# Patient Record
Sex: Male | Born: 1980 | Hispanic: Yes | Marital: Married | Smoking: Current some day smoker
Health system: Southern US, Community
[De-identification: ages and names within clinical notes are randomized; demographics above are authoritative.]

## PROBLEM LIST (undated history)

## (undated) DIAGNOSIS — F1020 Alcohol dependence, uncomplicated: Secondary | ICD-10-CM

---

## 2012-10-28 ENCOUNTER — Observation Stay (HOSPITAL_COMMUNITY)
Admission: EM | Admit: 2012-10-28 | Discharge: 2012-10-29 | Disposition: A | Discharge status: Home / Self Care | Payer: Self-pay | Attending: Internal Medicine | Admitting: Internal Medicine

## 2012-10-28 ENCOUNTER — Encounter (HOSPITAL_COMMUNITY): Payer: Self-pay | Admitting: Family Medicine

## 2012-10-28 ENCOUNTER — Emergency Department (HOSPITAL_COMMUNITY): Payer: Self-pay

## 2012-10-28 DIAGNOSIS — R0602 Shortness of breath: Secondary | ICD-10-CM | POA: Insufficient documentation

## 2012-10-28 DIAGNOSIS — F1010 Alcohol abuse, uncomplicated: Secondary | ICD-10-CM | POA: Insufficient documentation

## 2012-10-28 DIAGNOSIS — R109 Unspecified abdominal pain: Secondary | ICD-10-CM | POA: Insufficient documentation

## 2012-10-28 DIAGNOSIS — E872 Acidosis, unspecified: Secondary | ICD-10-CM | POA: Insufficient documentation

## 2012-10-28 DIAGNOSIS — R0789 Other chest pain: Secondary | ICD-10-CM | POA: Diagnosis present

## 2012-10-28 DIAGNOSIS — K2920 Alcoholic gastritis without bleeding: Secondary | ICD-10-CM

## 2012-10-28 DIAGNOSIS — E876 Hypokalemia: Secondary | ICD-10-CM | POA: Insufficient documentation

## 2012-10-28 DIAGNOSIS — R799 Abnormal finding of blood chemistry, unspecified: Secondary | ICD-10-CM | POA: Insufficient documentation

## 2012-10-28 DIAGNOSIS — R072 Precordial pain: Principal | ICD-10-CM | POA: Insufficient documentation

## 2012-10-28 DIAGNOSIS — R1110 Vomiting, unspecified: Secondary | ICD-10-CM

## 2012-10-28 LAB — URINALYSIS, ROUTINE W REFLEX MICROSCOPIC
Glucose, UA: NEGATIVE mg/dL
Hgb urine dipstick: NEGATIVE
Leukocytes, UA: NEGATIVE
Protein, ur: NEGATIVE mg/dL
Specific Gravity, Urine: 1.027 (ref 1.005–1.030)
pH: 5.5 (ref 5.0–8.0)

## 2012-10-28 LAB — POCT I-STAT TROPONIN I: Troponin i, poc: 0.01 ng/mL (ref 0.00–0.08)

## 2012-10-28 LAB — BASIC METABOLIC PANEL
Calcium: 9.6 mg/dL (ref 8.4–10.5)
GFR calc non Af Amer: >90 mL/min (ref >90)
Glucose, Bld: 114 mg/dL — ABNORMAL HIGH (ref 70–99)
Potassium: 3.2 mEq/L — ABNORMAL LOW (ref 3.5–5.1)
Sodium: 139 mEq/L (ref 135–145)

## 2012-10-28 LAB — POCT I-STAT 3, ART BLOOD GAS (G3+)
Acid-base deficit: 2 mmol/L (ref 0.0–2.0)
pCO2 arterial: 36.2 mmHg (ref 35.0–45.0)
pH, Arterial: 7.399 (ref 7.350–7.450)
pO2, Arterial: 77 mmHg — ABNORMAL LOW (ref 80.0–100.0)

## 2012-10-28 LAB — HEPATIC FUNCTION PANEL
Albumin: 4.5 g/dL (ref 3.5–5.2)
Indirect Bilirubin: 0.7 mg/dL (ref 0.3–0.9)
Total Protein: 8.6 g/dL — ABNORMAL HIGH (ref 6.0–8.3)

## 2012-10-28 LAB — CG4 I-STAT (LACTIC ACID): Lactic Acid, Venous: 2.32 mmol/L — ABNORMAL HIGH (ref 0.5–2.2)

## 2012-10-28 LAB — RAPID URINE DRUG SCREEN, HOSP PERFORMED
Cocaine: NOT DETECTED
Opiates: POSITIVE — AB

## 2012-10-28 LAB — CBC
Hemoglobin: 17.2 g/dL — ABNORMAL HIGH (ref 13.0–17.0)
MCH: 31 pg (ref 26.0–34.0)
MCHC: 36.8 g/dL — ABNORMAL HIGH (ref 30.0–36.0)
Platelets: 259 10*3/uL (ref 150–400)
RBC: 5.55 MIL/uL (ref 4.22–5.81)

## 2012-10-28 LAB — PRO B NATRIURETIC PEPTIDE: Pro B Natriuretic peptide (BNP): 8.3 pg/mL (ref 0–125)

## 2012-10-28 LAB — LIPASE, BLOOD: Lipase: 35 U/L (ref 11–59)

## 2012-10-28 LAB — SALICYLATE LEVEL: Salicylate Lvl: <2 mg/dL — ABNORMAL LOW (ref 2.8–20.0)

## 2012-10-28 LAB — MAGNESIUM: Magnesium: 2.1 mg/dL (ref 1.5–2.5)

## 2012-10-28 MED ORDER — POTASSIUM CHLORIDE IN NACL 20-0.9 MEQ/L-% IV SOLN
INTRAVENOUS | Status: DC
  Administered 2012-10-28 – 2012-10-29 (×2): via INTRAVENOUS
  Filled 2012-10-28 (×4): qty 1000

## 2012-10-28 MED ORDER — ADULT MULTIVITAMIN W/MINERALS CH
1.0000 | ORAL_TABLET | Freq: Every day | ORAL | Status: DC
Start: 2012-10-28 — End: 2012-10-29
  Administered 2012-10-28 – 2012-10-29 (×2): 1 via ORAL
  Filled 2012-10-28 (×2): qty 1

## 2012-10-28 MED ORDER — NITROGLYCERIN 0.4 MG SL SUBL: 0.4000 mg | SUBLINGUAL_TABLET | SUBLINGUAL | Status: DC | PRN

## 2012-10-28 MED ORDER — GI COCKTAIL ~~LOC~~: 30.0000 mL | Freq: Three times a day (TID) | ORAL | Status: DC | PRN

## 2012-10-28 MED ORDER — HEPARIN SODIUM (PORCINE) 5000 UNIT/ML IJ SOLN
5000.0000 [IU] | Freq: Three times a day (TID) | INTRAMUSCULAR | Status: DC
  Administered 2012-10-28 – 2012-10-29 (×2): 5000 [IU] via SUBCUTANEOUS
  Filled 2012-10-28 (×5): qty 1

## 2012-10-28 MED ORDER — ONDANSETRON HCL 4 MG/2ML IJ SOLN: 4.0000 mg | Freq: Four times a day (QID) | INTRAMUSCULAR | Status: DC | PRN

## 2012-10-28 MED ORDER — SODIUM CHLORIDE 0.9 % IJ SOLN: 3.0000 mL | Freq: Two times a day (BID) | INTRAMUSCULAR | Status: DC

## 2012-10-28 MED ORDER — PNEUMOCOCCAL VAC POLYVALENT 25 MCG/0.5ML IJ INJ
0.5000 mL | INJECTION | INTRAMUSCULAR | Status: AC
  Administered 2012-10-29: 0.5 mL via INTRAMUSCULAR
  Filled 2012-10-28: qty 0.5

## 2012-10-28 MED ORDER — SODIUM CHLORIDE 0.9 % IV BOLUS (SEPSIS)
500.0000 mL | Freq: Once | INTRAVENOUS | Status: AC
  Administered 2012-10-28: 1000 mL via INTRAVENOUS

## 2012-10-28 MED ORDER — GI COCKTAIL ~~LOC~~
30.0000 mL | Freq: Once | ORAL | Status: AC
  Administered 2012-10-28: 30 mL via ORAL
  Filled 2012-10-28: qty 30

## 2012-10-28 MED ORDER — ASPIRIN EC 325 MG PO TBEC
325.0000 mg | DELAYED_RELEASE_TABLET | Freq: Once | ORAL | Status: AC
  Administered 2012-10-28: 325 mg via ORAL
  Filled 2012-10-28: qty 1

## 2012-10-28 MED ORDER — PANTOPRAZOLE SODIUM 40 MG PO TBEC
40.0000 mg | DELAYED_RELEASE_TABLET | Freq: Two times a day (BID) | ORAL | Status: DC
  Administered 2012-10-28 – 2012-10-29 (×2): 40 mg via ORAL
  Filled 2012-10-28 (×2): qty 1

## 2012-10-28 MED ORDER — SODIUM CHLORIDE 0.9 % IV SOLN
Freq: Once | INTRAVENOUS | Status: AC
  Administered 2012-10-28: 16:00:00 via INTRAVENOUS

## 2012-10-28 MED ORDER — LORAZEPAM 2 MG/ML IJ SOLN: 1.0000 mg | Freq: Four times a day (QID) | INTRAMUSCULAR | Status: DC | PRN

## 2012-10-28 MED ORDER — THIAMINE HCL 100 MG/ML IJ SOLN
100.0000 mg | Freq: Every day | INTRAMUSCULAR | Status: DC
  Filled 2012-10-28 (×2): qty 1

## 2012-10-28 MED ORDER — ONDANSETRON HCL 8 MG PO TABS
4.0000 mg | ORAL_TABLET | Freq: Four times a day (QID) | ORAL | Status: DC | PRN
  Filled 2012-10-28: qty 0.5

## 2012-10-28 MED ORDER — MORPHINE SULFATE 2 MG/ML IJ SOLN: 1.0000 mg | INTRAMUSCULAR | Status: DC | PRN

## 2012-10-28 MED ORDER — LORAZEPAM 1 MG PO TABS: 1.0000 mg | ORAL_TABLET | Freq: Four times a day (QID) | ORAL | Status: DC | PRN

## 2012-10-28 MED ORDER — ASPIRIN 81 MG PO CHEW: 81.0000 mg | CHEWABLE_TABLET | Freq: Every day | ORAL | Status: DC

## 2012-10-28 MED ORDER — VITAMIN B-1 100 MG PO TABS
100.0000 mg | ORAL_TABLET | Freq: Every day | ORAL | Status: DC
  Administered 2012-10-28: 100 mg via ORAL
  Filled 2012-10-28 (×2): qty 1

## 2012-10-28 MED ORDER — FOLIC ACID 1 MG PO TABS
1.0000 mg | ORAL_TABLET | Freq: Every day | ORAL | Status: DC
  Administered 2012-10-28: 1 mg via ORAL
  Filled 2012-10-28 (×2): qty 1

## 2012-10-28 MED ORDER — POTASSIUM CHLORIDE CRYS ER 20 MEQ PO TBCR
40.0000 meq | EXTENDED_RELEASE_TABLET | Freq: Once | ORAL | Status: AC
  Administered 2012-10-28: 40 meq via ORAL
  Filled 2012-10-28: qty 2

## 2012-10-28 MED ORDER — MORPHINE SULFATE 4 MG/ML IJ SOLN
2.0000 mg | Freq: Once | INTRAMUSCULAR | Status: AC
Start: 2012-10-28 — End: 2012-10-28
  Administered 2012-10-28: 2 mg via INTRAVENOUS
  Filled 2012-10-28: qty 1

## 2012-10-28 NOTE — H&P (Signed)
Hospital Admission Note Date: 10/28/2012  Patient name: Darren Ellis Medical record number: [id number] Date of birth: [birth year] Age: 32 y.o. Gender: male PCP: No primary provider on file.  Medical Service:Internal Medicine Teaching Service  Attending physician: Dr. Eben Burow    1st Contact: Dr. Cicero Duck 2nd Contact: Dr. Dorise Hiss    Pager:[phone] After 5 pm or weekends: 1st Contact:      Pager: [phone] 2nd Contact:      Pager: [phone]  Chief Complaint: Substernal chest pain  History of Present Illness:Darren Ellis is a 32 year old spanish speaking male with no pcp presenting to the ED today with complaints of substernal sharp chest pain.  The pain is located in the middle of his chest near the xiphoid process, 8/10, non-radiating, burning in nature, constant, worse with deep inspiration and movement, not exacerbated with food or drink, and slightly improved with GI cocktail.  His wife is present in the room as well.  They explain that he drank 6 12oz beers yesterday and shortly after he started having this pain.  He could not lie flat at night to sleep due to the discomfort and has associated shortness of breath with difficulty catching his breath due to pain.  His wife also explains that while they were driving and in triage, he suddenly started clenching his fists and his jaw and seemed to not respond for some time.  He does not recall much of that episode but thinks it was related to the pain.  They deny any similar episodes in the past with the fist clenching, and deny such sharp chest pain, but does admit to mild discomfort in the past with alcohol.  He has not taken anything for the pain at home, endorses dry heaving, minimal vomiting and nausea but denies any current fever, abdominal pain, or any urinary complaints at this time.  He does endorse b/l elbow pain. Since arrival in the ED he has been having chills as well.    Meds: No current outpatient prescriptions on  file.  Allergies: Allergies as of 10/28/2012  . (No Known Allergies)   History reviewed. No pertinent past medical history. History reviewed. No pertinent past surgical history. History reviewed. No pertinent family history. History   Social History  . Marital Status: Married    Spouse Name: N/A    Number of Children: N/A  . Years of Education: N/A   Occupational History  . construction     dry wall, paintaing, insulation   Social History Main Topics  . Smoking status: Current Every Day Smoker    Types: Cigars  . Smokeless tobacco: Not on file  . Alcohol Use: Yes     Comment: occ. a few beers  . Drug Use: No  . Sexually Active: Yes -- Male partner(s)   Other Topics Concern  . Not on file   Social History Narrative  . No narrative on file   Review of Systems:  Constitutional:   Chills.  Denies fever, diaphoresis, appetite change and fatigue.   HEENT:  Denies congestion, sore throat, rhinorrhea, sneezing, mouth sores, trouble swallowing, neck pain   Respiratory:  Pain with inspiration causing shallow breathing.  Denies SOB, DOE, cough, and wheezing.   Cardiovascular:  Denies palpitations and leg swelling. Substernal burning chest pain.   Gastrointestinal:  Nausea, vomiting.  Denies abdominal pain, diarrhea, constipation, blood in stool and abdominal distention.   Genitourinary:  Denies dysuria, urgency, frequency, hematuria, flank pain and difficulty urinating.  Musculoskeletal:  B/L elbow pain and episode of fist clenching.  Denies myalgias, back pain, joint swelling, and gait problem.   Skin:  Denies pallor, rash and wound.   Neurological:  Denies dizziness, seizures, syncope, weakness, light-headedness, numbness and headaches.    Physical Exam: Blood pressure 136/84, pulse 100, temperature 98 F (36.7 C), temperature source Oral, resp. rate 19, SpO2 100.00%. Vitals reviewed. General: resting in bed, acute distress due to pain HEENT: PERRL, EOMI, no scleral  icterus Cardiac: Tachycardia, no rubs, murmurs or gallops Chest: tenderness to palpation mid chest between breasts at nipple line Pulm: clear to auscultation bilaterally, no wheezes, rales, or rhonchi, shallow breathing Abd: soft, nontender, nondistended, BS present Ext: warm and well perfused, no pedal edema Neuro: alert and oriented X3, cranial nerves II-XII grossly intact, strength slightly weaker in RUE as compared to LUE, poor effort, and sensation to light touch equal in bilateral upper and lower extremities  Lab results: Basic Metabolic Panel:  Recent Labs  09/81/19 1429  NA 139  K 3.2*  CL 99  CO2 16*  GLUCOSE 114*  BUN 11  CREATININE 0.77  CALCIUM 9.6   Liver Function Tests:  Recent Labs  10/28/12 1429  AST 42*  ALT 35  ALKPHOS 96  BILITOT 0.9  PROT 8.6*  ALBUMIN 4.5    Recent Labs  10/28/12 1429  LIPASE 35   CBC:  Recent Labs  10/28/12 1429  WBC 10.9*  HGB 17.2*  HCT 46.7  MCV 84.1  PLT 259   BNP:  Recent Labs  10/28/12 1429  PROBNP 8.3   Urine Drug Screen: Drugs of Abuse     Component Value Date/Time   LABOPIA POSITIVE* 10/28/2012 1613   COCAINSCRNUR NONE DETECTED 10/28/2012 1613   LABBENZ NONE DETECTED 10/28/2012 1613   AMPHETMU NONE DETECTED 10/28/2012 1613   THCU NONE DETECTED 10/28/2012 1613   LABBARB NONE DETECTED 10/28/2012 1613    Alcohol Level:  Recent Labs  10/28/12 1556  ETH <11   Urinalysis:  Recent Labs  10/28/12 1613  COLORURINE YELLOW  LABSPEC 1.027  PHURINE 5.5  GLUCOSEU NEGATIVE  HGBUR NEGATIVE  BILIRUBINUR SMALL*  KETONESUR 15*  PROTEINUR NEGATIVE  UROBILINOGEN 1.0  NITRITE NEGATIVE  LEUKOCYTESUR NEGATIVE   Imaging results:  Dg Chest Port 1 View  10/28/2012  *RADIOLOGY REPORT*  Clinical Data: Abdominal pain, shortness of breath.  PORTABLE CHEST - 1 VIEW  Comparison: None  Findings: Heart size is accentuated by low volumes.  No confluent airspace opacity or effusion.  No acute bony abnormality.   IMPRESSION: Low lung volumes.  No acute findings.   Original Report Authenticated By: Charlett Nose, M.D.    Other results: EKG: 78bpm NSR  Assessment & Plan by Problem: Darren Ellis is a 32 year old male Spanish speaking male admitted for substernal chest discomfort.      Substernal chest pain--x2 days, localized to mid chest between breasts, tender to palpation, burning sensation, non-radiating, 8/10.  Started after drinking 6 beers yesterday.  Reports mild discomfort in the past with alcohol but not as severe.  No dysphagia, no radiation to neck or shoulders.  Pain could be secondary to gastritis as a result of chronic EtOH use vs. Hiatal hernia vs. MSK vs. Esophagitis (no hx of pill intake but does have GERD symptoms).  He denies NSAID use and has no abdominal pain, making PUD less likely.  Pancreatitis is considered given alcohol history, however, no epigastric pain, no radiation of pain to the back,  and Lipase WNL 35.  Mild improvement of pain with GI cocktail in ED.  CXR on admission low lung volumes, no acute findings. No air under the diaphragm making perforation less likely. Cannot rule out ACS, although no radiatio of pain and tender to palpation.  Troponin x1 poc in ED negative 0.01.  No known hx of HTN or DM but has not seen a doctor in a long time.  -admit to tele -cycle CE -ASA -gi cocktail -zofran prn nausea -protonix 40mg  qd -risk stratify: HbA1C, and lipid panel -AM labs: CBC, BMET -morphine prn pain -nitroglycerin prn -AM EKG -oxygen therapy prn, keep o2 sat >90% -consider GI consult if pain worsens    Increased anion gap--AG 24 on admission with Bicarb on admission cmet: 16.  per ABG: pH 7.399,  PCO2: 36.2, P02: 77, HCO3: 22.4. o2 sat: 95%.  Delta delta 1.75 signifying additional metabolic alkalosis (possibly secondary to vomiting) and per winters formula, expected PaCO2=30-34, but actual CO2 was 36.2, therefore more than expected value signifying additional respiratory  disturbance (likely secondary to hypoventilation due to pain with inspiration).  Recent alcohol intake of 6 beers yesterday.  Denies intake of any other substances.  No signs of uremia with Cr 0.77 on admission.  Lactic acid slightly elevated at 2.32. U/A: 15 ketones with pH 5.5 with small bilirubin.  Denies hx of DM with glucose on cmet 114. Possible alcoholic ketoacidosis in setting of recent binge day prior to admisison.  Salicylate level negative on admission.    -f/u serum osmolality and calculate osmolar gap -IVF  -CIWA protocol including thiamine and folate -trend bmet    Hypokalemia--K 3.2 on admission.  Possibly 2/2 GI losses, endorsed some vomiting prior to admission.  -replaced and NS with K IVF -f/u magnesium     ?seizure like activity--wife endorses one episode of fist clenching and tight jaw, but still able to speak at time of extreme pain at time of triage.  She claims he was slow to respond verbally and staring off in space but moving his eyes and talking.  Given hx of alcohol abuse, will need to monitor closely for signs of withdrawal.  Neuro exam was significant for only slight right sided upper extremity weakness however seemed more effort dependent as he was lying down at an angle to prevent exacerbation of pain.  Otherwise, CN's were intact and sensation to pin prick grossly intact and equal as well.  Denies any headaches.  -monitor for withdrawal  -CIWA protocol  -if behavior returns, will consider further work up  Diet: Regular diet as tolerated DVT Ppx: Heparin  Dispo: Disposition is deferred at this time, awaiting improvement of current medical problems. Anticipated discharge in approximately 1-2 day(s).   The patient does not have a current PCP (No primary provider on file.), therefore will be requiring OPC follow-up after discharge.   The patient does not have transportation limitations that hinder transportation to clinic appointments.  SignedDarden Palmer 10/28/2012, 5:26 PM

## 2012-10-28 NOTE — ED Provider Notes (Signed)
Medical screening examination/treatment/procedure(s) were conducted as a shared visit with non-physician practitioner(s) and myself.  I personally evaluated the patient during the encounter   Nelia Shi, MD 10/28/12 [phone]

## 2012-10-28 NOTE — ED Notes (Signed)
Per pt sts about 2 hours ago started to have burning in his stomach and SOB. sts he drank 6 beers last night. Denies N,V,D. sts his hands keep cramping up. Pt A&Ox3.

## 2012-10-28 NOTE — ED Provider Notes (Signed)
History     CSN: [id number]  Arrival date & time 10/28/12  1315   First MD Initiated Contact with Patient 10/28/12 1333      Chief Complaint  Patient presents with  . Abdominal Pain  . Shortness of Breath   an interpreter was used to obtain history of present illness  (Consider location/radiation/quality/duration/timing/severity/associated sxs/prior treatment) HPI  Patient is a 32 year old male with no past medical history presenting to the ED for intermittent weeklong at the epigastric pain, that worsened this morning. Patient describes pain as burning with radiation to central chest there is a 9/10 right now. He is having associated hand muscle cramping that began this morning as well. He also states he is having some chills and sweating. Patient denies any cardiac or GI history. Patient also denies any familial cardiac or GI history. He denies any abdominal surgeries. Denies any nausea, vomiting, diarrhea. Patient has been able to tolerate by mouth intake without difficulty. He states that has not worsened epigastric pain. Denies any fevers. Denies ever having a similar presentation like this in past. Patient states he drank "6 beers last night." Denies drinking this amount of alcohol every night. States it is intermittent.   History reviewed. No pertinent past medical history.  History reviewed. No pertinent past surgical history.  History reviewed. No pertinent family history.  History  Substance Use Topics  . Smoking status: Never Smoker   . Smokeless tobacco: Not on file  . Alcohol Use: Yes     Comment: occ. a few beers      Review of Systems  Constitutional: Positive for chills. Negative for appetite change.  HENT: Negative.   Eyes: Negative.   Respiratory: Positive for chest tightness and shortness of breath.   Gastrointestinal: Positive for abdominal pain. Negative for nausea, vomiting, diarrhea, blood in stool, anal bleeding and rectal pain.  Genitourinary:  Negative.   Musculoskeletal:       Hand cramping  Skin: Negative.   Neurological: Negative.  Negative for weakness.    Allergies  Review of patient's allergies indicates no known allergies.  Home Medications  No current outpatient prescriptions on file.  BP 136/84  Pulse 100  Temp(Src) 98 F (36.7 C) (Oral)  Resp 19  SpO2 100%  Physical Exam  Constitutional: He is oriented to person, place, and time. He appears well-developed and well-nourished.  Mildly diaphoretic  HENT:  Head: Normocephalic and atraumatic.  Mouth/Throat: Oropharynx is clear and moist. No oropharyngeal exudate.  Eyes: Conjunctivae and EOM are normal. Pupils are equal, round, and reactive to light.  Neck: Neck supple.  Cardiovascular: Normal rate, regular rhythm and normal heart sounds.   Pulses:      Posterior tibial pulses are 2+ on the right side, and 2+ on the left side.  Pulmonary/Chest: Effort normal and breath sounds normal. No respiratory distress. He has no wheezes. He has no rales. He exhibits no tenderness.  Abdominal: Soft. Bowel sounds are normal. He exhibits no distension. There is no tenderness. There is no rigidity, no rebound, no guarding and negative Murphy's sign.  Neurological: He is alert and oriented to person, place, and time.  Skin: Skin is warm and dry.  Psychiatric: His mood appears anxious.    ED Course  Procedures (including critical care time)  Medications  sodium chloride 0.9 % bolus 500 mL (not administered)  0.9 %  sodium chloride infusion (not administered)  gi cocktail (Maalox,Lidocaine,Donnatal) (30 mLs Oral Given 10/28/12 1437)  morphine 4 MG/ML injection 2  mg (2 mg Intravenous Given 10/28/12 1437)    Labs Reviewed  CBC - Abnormal; Notable for the following:    WBC 10.9 (*)    Hemoglobin 17.2 (*)    MCHC 36.8 (*)    All other components within normal limits  BASIC METABOLIC PANEL - Abnormal; Notable for the following:    Potassium 3.2 (*)    CO2 16 (*)     Glucose, Bld 114 (*)    All other components within normal limits  HEPATIC FUNCTION PANEL - Abnormal; Notable for the following:    Total Protein 8.6 (*)    AST 42 (*)    All other components within normal limits  LIPASE, BLOOD  PRO B NATRIURETIC PEPTIDE  POCT I-STAT TROPONIN I   Dg Chest Port 1 View  10/28/2012  *RADIOLOGY REPORT*  Clinical Data: Abdominal pain, shortness of breath.  PORTABLE CHEST - 1 VIEW  Comparison: None  Findings: Heart size is accentuated by low volumes.  No confluent airspace opacity or effusion.  No acute bony abnormality.  IMPRESSION: Low lung volumes.  No acute findings.   Original Report Authenticated By: Charlett Nose, M.D.    Anion gap 24 - possible ETOH induced acidosis.   Hospitalist consulted for admission.   1. Alcoholic gastritis   2. High anion gap metabolic acidosis       MDM  Based on patient presentation and anion gap he will be admitted. The patient appears reasonably stabilized for admission considering the current resources, flow, and capabilities available in the ED at this time, and I doubt any other [facility] requiring further screening and/or treatment in the ED prior to admission. Patient d/w with Dr. Radford Pax, agrees with plan.           Jeannetta Ellis, PA-C 10/28/12 1606

## 2012-10-28 NOTE — ED Notes (Signed)
Admitting MD at bedside.

## 2012-10-29 DIAGNOSIS — F1010 Alcohol abuse, uncomplicated: Secondary | ICD-10-CM | POA: Diagnosis not present

## 2012-10-29 DIAGNOSIS — R0789 Other chest pain: Secondary | ICD-10-CM | POA: Diagnosis present

## 2012-10-29 LAB — LIPID PANEL
Cholesterol: 166 mg/dL (ref 0–200)
Triglycerides: 106 mg/dL (ref <150)

## 2012-10-29 LAB — BASIC METABOLIC PANEL
Calcium: 8.9 mg/dL (ref 8.4–10.5)
Creatinine, Ser: 0.74 mg/dL (ref 0.50–1.35)
GFR calc non Af Amer: >90 mL/min (ref >90)
Glucose, Bld: 125 mg/dL — ABNORMAL HIGH (ref 70–99)
Sodium: 136 mEq/L (ref 135–145)

## 2012-10-29 LAB — CBC
MCH: 30.2 pg (ref 26.0–34.0)
MCV: 83.9 fL (ref 78.0–100.0)
Platelets: 207 10*3/uL (ref 150–400)
RBC: 4.83 MIL/uL (ref 4.22–5.81)
RDW: 12.8 % (ref 11.5–15.5)

## 2012-10-29 LAB — HEMOGLOBIN A1C: Hgb A1c MFr Bld: 5.3 % (ref <5.7)

## 2012-10-29 LAB — TROPONIN I: Troponin I: <0.3 ng/mL (ref <0.30)

## 2012-10-29 MED ORDER — ADULT MULTIVITAMIN W/MINERALS CH: 1.0000 | ORAL_TABLET | Freq: Every day | ORAL | Status: DC

## 2012-10-29 MED ORDER — THIAMINE HCL 100 MG PO TABS: 100.0000 mg | ORAL_TABLET | Freq: Every day | ORAL | Status: DC

## 2012-10-29 MED ORDER — FOLIC ACID 1 MG PO TABS: 1.0000 mg | ORAL_TABLET | Freq: Every day | ORAL | Status: DC

## 2012-10-29 MED ORDER — OMEPRAZOLE 40 MG PO CPDR: DELAYED_RELEASE_CAPSULE | ORAL | Status: DC

## 2012-10-29 MED ORDER — OMEPRAZOLE 40 MG PO CPDR: 40.0000 mg | DELAYED_RELEASE_CAPSULE | Freq: Every day | ORAL | Status: DC

## 2012-10-29 NOTE — H&P (Signed)
Internal Medicine Attending Admission Note Date: 10/29/2012  Patient name: Darren Ellis Medical record number: [id number] Date of birth: [birth year] Age: 32 y.o. Gender: male  I saw and evaluated the patient. I reviewed the resident's note and I agree with the resident's findings and plan as documented in the resident's note.  Chief Complaint(s): Chest pain  History - key components related to admission: Patient is a 32 year old Spanish-speaking male with no past medical history and no primary care physician who presented to emergency room after complaints of substernal chest pain. Patient did binge drinking on the night prior to admission with about 6 12 ounce beers. Pain started shortly after drinking alcohol. Present in the middle of his chest, upper abdomen, 8/10 in intensity, nonradiating, burning in nature, constant, worse with deep breathing, no other exacerbating factors. Patient also endorsed relief in symptoms of her GI cocktail.  15 point review of system was negative except what is noted above.  Past medical history, past surgical history, medications, family history and social history was reviewed and as per resident's note.   Physical Exam - key components related to admission:  Filed Vitals:   10/28/12 1832 10/28/12 1835 10/29/12 0006 10/29/12 0605  BP: 132/81 126/74 109/68 104/64  Pulse: 77 67 64 59  Temp:   98 F (36.7 C) 98.1 F (36.7 C)  TempSrc:      Resp:   18 16  Height:      Weight:      SpO2:   99% 98%  Physical Exam: General: Vital signs reviewed and noted. Well-developed, well-nourished, in no acute distress; alert, appropriate and cooperative throughout examination.  Head: Normocephalic, atraumatic.  Eyes: PERRL, EOMI, No signs of anemia or jaundince.  Nose: Mucous membranes moist, not inflammed, nonerythematous.  Throat: Oropharynx nonerythematous, no exudate appreciated.   Neck: No deformities, masses, or tenderness noted.Supple, No carotid Bruits, no  JVD.  Lungs:   tender to palpation above the sternum, Normal respiratory effort. Clear to auscultation BL without crackles or wheezes.  Heart: RRR. S1 and S2 normal without gallop, murmur, or rubs.  Abdomen:  BS normoactive. Soft, Nondistended, non-tender.  No masses or organomegaly.  Extremities: No pretibial edema.  Neurologic: A&O X3, CN II - XII are grossly intact. Motor strength is 5/5 in the all 4 extremities, Sensations intact to light touch, Cerebellar signs negative.  Skin: No visible rashes, scars.   Lab results:  Basic Metabolic Panel:  Recent Labs  11/91/47 1429 10/28/12 1823 10/28/12 2355  NA 139  --  136  K 3.2*  --  3.7  CL 99  --  103  CO2 16*  --  26  GLUCOSE 114*  --  125*  BUN 11  --  11  CREATININE 0.77  --  0.74  CALCIUM 9.6  --  8.9  MG  --  2.1  --    Liver Function Tests:  Recent Labs  10/28/12 1429  AST 42*  ALT 35  ALKPHOS 96  BILITOT 0.9  PROT 8.6*  ALBUMIN 4.5    Recent Labs  10/28/12 1429  LIPASE 35   CBC:  Recent Labs  10/28/12 1429 10/28/12 2355  WBC 10.9* 8.6  HGB 17.2* 14.6  HCT 46.7 40.5  MCV 84.1 83.9  PLT 259 207   Cardiac Enzymes:  Recent Labs  10/28/12 1823 10/28/12 2356  TROPONINI <0.30 <0.30   Fasting Lipid Panel:  Recent Labs  10/28/12 2355  CHOL 166  HDL 40  LDLCALC 105*  TRIG 106  CHOLHDL 4.2    Alcohol Level:  Recent Labs  10/28/12 1556  ETH <11   Imaging results:  Dg Chest Port 1 View  10/28/2012  *RADIOLOGY REPORT*  Clinical Data: Abdominal pain, shortness of breath.  PORTABLE CHEST - 1 VIEW  Comparison: None  Findings: Heart size is accentuated by low volumes.  No confluent airspace opacity or effusion.  No acute bony abnormality.  IMPRESSION: Low lung volumes.  No acute findings.   Original Report Authenticated By: Charlett Nose, M.D.     Other results: EKG: 78 beats per minute, sinus rhythm, early repolarization in leads V3 which could be from incorrect lead placement. No ST or T  wave changes noted  Assessment & Plan by Problem:  Principal Problem:   Substernal chest pain Active Problems:   Vomiting   Hypokalemia   Metabolic acidosis, increased anion gap  Patient is a 32 year old healthy Spanish-speaking male who was admitted with atypical chest pain after binge drinking.   1. Atypical chest pain: Likelihood of this being coronary in origin is extremely low based on history and physical exam(chest pain is palpable and worse with deep breathing). This is most likely secondary to musculoskeletal versus acute esophagitis from binge drinking and vomiting. Given the low likelihood of ACS based on his age and risk factor profile, I do not think that he needs to be evaluated further for acute coronary syndrome in outpatient setting.  2. anion gap metabolic acidosis: This was most likely secondary to alcoholic acidosis. Patient's anion gap has closed this morning with IV fluids. I would not look for any other cause at this time.  We should advance patient's diet and anticipate discharge today if he is able to tolerate regular diet well. We should discharge home on twice daily PPI for 2 weeks with OPC follow up. Counseled for avoiding alcohol and smoking for atleast 2 weeks.   Rest of the medical management as per resident's.  Lars Mage MD Faculty-Internal Medicine Residency Program

## 2012-10-29 NOTE — Progress Notes (Signed)
Pt discharged to home per MD order. Pt and wife received and reviewed all discharge instructions and medication information including follow-up appointments and prescriptions. Pt and wife verbalized understanding. Pt alert and oriented at discharge with no complaints of pain. Pt ambulated to private vehicle per pt request at discharge. Efraim Kaufmann

## 2012-10-29 NOTE — Discharge Summary (Signed)
Internal Medicine Teaching [facility] Discharge Note  Name: Darren Ellis MRN: [id number] DOB: 06-Oct-1980 32 y.o.  Date of Admission: 10/28/2012  1:19 PM Date of Discharge: 10/29/2012 Attending Physician: Dr. Eben Burow Discharge Diagnosis: Principal Problem:   Atypical chest pain Active Problems:   Alcohol abuse   Hypokalemia   Metabolic acidosis, increased anion gap  Discharge Medications:   Medication List    TAKE these medications       multivitamin with minerals Tabs  Take 1 tablet by mouth daily.     omeprazole 40 MG capsule  Commonly known as:  PRILOSEC  Take 40mg  twice daily x2 weeks then 40mg  once daily x6 weeks       Disposition and follow-up:   Darren Ellis was discharged from [facility] in Stable condition.  At the hospital follow up visit please address: Esophagitis--presented with severe substernal pain.  D/c on 2 weeks of prilosec 40 bid and then 6 weeks of prilosec qd with likely titration to low dose and then h2 blocker if symptoms improve.  If pain worsening, concern of PUD, will need GI eval, possible EGD.  Lifestyle modifications recommended as well.   Anion gap acidosis: likely 2/2 alcoholic ketoacidosis--strongly urged to quit alcohol. AG 7 on discharge from 24 on admission.  Improvement with fluids.     [facility] card needed, no insurance. Needs to establish PCP  Alcohol abuse: no signs of withdrawal during admission, placed on CIWA.  Cessation strongly advised.  Continue cessation counseling and monitor for withdrawal.    Follow-up Appointments:      Discharge Orders   Future Orders Complete By Expires     Call MD for:  severe uncontrolled pain  As directed     Call MD for:  As directed     Comments:      Persistent diarrhea after taking medication    Diet - low sodium heart healthy  As directed     Increase activity slowly  As directed       Procedures Performed:  Dg Chest Port 1 View  10/28/2012  *RADIOLOGY REPORT*   Clinical Data: Abdominal pain, shortness of breath.  PORTABLE CHEST - 1 VIEW  Comparison: None  Findings: Heart size is accentuated by low volumes.  No confluent airspace opacity or effusion.  No acute bony abnormality.  IMPRESSION: Low lung volumes.  No acute findings.   Original Report Authenticated By: Charlett Nose, M.D.    Admission HPI: Darren Ellis is a 32 year old spanish speaking male with no pcp presenting to the ED today with complaints of substernal sharp chest pain. The pain is located in the middle of his chest near the xiphoid process, 8/10, non-radiating, burning in nature, constant, worse with deep inspiration and movement, not exacerbated with food or drink, and slightly improved with GI cocktail. His wife is present in the room as well. They explain that he drank 6 12oz beers yesterday and shortly after he started having this pain. He could not lie flat at night to sleep due to the discomfort and has associated shortness of breath with difficulty catching his breath due to pain. His wife also explains that while they were driving and in triage, he suddenly started clenching his fists and his jaw and seemed to not respond for some time. He does not recall much of that episode but thinks it was related to the pain. They deny any similar episodes in the past with the fist clenching, and deny such sharp  chest pain, but does admit to mild discomfort in the past with alcohol. He has not taken anything for the pain at home, endorses dry heaving, minimal vomiting and nausea but denies any current fever, abdominal pain, or any urinary complaints at this time. He does endorse b/l elbow pain. Since arrival in the ED he has been having chills as well.   Hospital Course by problem list:    Atypical chest pain--likely secondary to alcohol induced esophagitis vs. MSK.  Improved during hospital course.  Presented initially with x2 days, localized to mid chest between breasts, tender to palpation, burning  sensation, non-radiating, 8/10. Started after drinking 6 beers day prior to admission. Reported mild discomfort in the past with alcohol but not as severe. No dysphagia, no radiation to neck or shoulders. Improvement in ED with GI cocktail.  Other differentials may include Hiatal hernia.  He denies NSAID use and has no abdominal pain at this time. However, due to severity of pain on admission concern for possible PUD as well. CXR on admission showed low lung volumes, no acute findings. No air under the diaphragm making perforation less likely. ACS was considered but likelihood low based on physical exam and history.  Cardiac enzymes were negative.  Lipid panel: LDL 105, TG 106, HDL 40, Chol 166.  HbA1C pending.  Recommended cessation of alcohol and smoking cigars. Recommend lifestyle modifications, diet modifications avoiding GERD triggers, and discharged on PPI therapy for 8 weeks.  With concern of possible PUD due to severity of pain, suggested prilosec 40mg  bid x2 weeks and then transition to qd dosing for 6 weeks with eventual decrease to low dose PPIs and then to H2RAs if mild or intermittent symptoms with eventual discontinuation of acid suppression if remains asymptomatic. If symptoms persist or worsen, may benefit from outpatient GI follow up and possible EGD; however, he has no insurance and thus will likely be unable to afford. He will need follow up with a PCP (needs to be established) and in the mean time have recommended follow up in IM OPC.      Hypokalemia----K 3.2 on admission. Possibly 2/2 GI losses, endorsed some vomiting prior to admission. Mag 2.1.  K improved to 3.7 during hospital course.     Increased anion gap acidosis--likely secondary to alcoholic ketoacidosis as a result of binge drinking prior to admission.  AG resolved during hospital course.  AG 24 on admission with Bicarb on admission cmet: 16. per ABG: pH 7.399, PCO2: 36.2, P02: 77, HCO3: 22.4. o2 sat: 95%. Delta delta 1.75  signifying additional metabolic alkalosis (possibly secondary to vomiting) and per winters formula, expected PaCO2=30-34, but actual CO2 was 36.2, therefore more than expected value signifying additional respiratory disturbance (likely secondary to hypoventilation due to pain with inspiration). Recent alcohol intake of 6 beers yesterday. Denied intake of any other substances. No signs of uremia with Cr 0.77 on admission. Lactic acid was slightly elevated at 2.32. U/A: 15 ketones with pH 5.5 with small bilirubin. Denies hx of DM with glucose on cmet 114. Salicylate level negative on admission. Serum osmolality 288.  AG down to 7 on discharge with improvement with IVF. Counseled extensively on alcohol cessation.  Follow up with PCP.     Alcohol Abuse--reports drinking 12 oz beers x6 day prior to admission that resulted in pain and also hx of frequent alcohol use.  Brief episode as described by wife of fist and jaw clenching during pain episode in triage with starting off but moving eyes and  talking. No similar episodes during admission.  Placed on CIWA protocol with thiamine and folate during admission but no signs of withdrawal during hospital course.   Counseling provided and cessation strongly advised.  Will need to follow up with PCP and monitored for symptoms of withdrawal.  If symptoms similar to fist or jaw clenching return, may need further follow up with PCP and work up.    Discharge Vitals:  BP 104/64  Pulse 59  Temp(Src) 98.1 F (36.7 C) (Oral)  Resp 16  Ht 5\' 7"  (1.702 m)  Wt 153 lb 6.4 oz (69.582 kg)  BMI 24.02 kg/m2  SpO2 98%  Discharge Labs:  Results for orders placed during the hospital encounter of 10/28/12 (from the past 24 hour(s))  PRO B NATRIURETIC PEPTIDE     Status: None   Collection Time    10/28/12  2:29 PM      Result Value Range   Pro B Natriuretic peptide (BNP) 8.3  0 - 125 pg/mL  CBC     Status: Abnormal   Collection Time    10/28/12  2:29 PM      Result Value Range    WBC 10.9 (*) 4.0 - 10.5 K/uL   RBC 5.55  4.22 - 5.81 MIL/uL   Hemoglobin 17.2 (*) 13.0 - 17.0 g/dL   HCT 16.1  09.6 - 04.5 %   MCV 84.1  78.0 - 100.0 fL   MCH 31.0  26.0 - 34.0 pg   MCHC 36.8 (*) 30.0 - 36.0 g/dL   RDW 40.9  81.1 - 91.4 %   Platelets 259  150 - 400 K/uL  BASIC METABOLIC PANEL     Status: Abnormal   Collection Time    10/28/12  2:29 PM      Result Value Range   Sodium 139  135 - 145 mEq/L   Potassium 3.2 (*) 3.5 - 5.1 mEq/L   Chloride 99  96 - 112 mEq/L   CO2 16 (*) 19 - 32 mEq/L   Glucose, Bld 114 (*) 70 - 99 mg/dL   BUN 11  6 - 23 mg/dL   Creatinine, Ser 7.82  0.50 - 1.35 mg/dL   Calcium 9.6  8.4 - 95.6 mg/dL   GFR calc non Af Amer >90  >90 mL/min   GFR calc Af Amer >90  >90 mL/min  LIPASE, BLOOD     Status: None   Collection Time    10/28/12  2:29 PM      Result Value Range   Lipase 35  11 - 59 U/L  HEPATIC FUNCTION PANEL     Status: Abnormal   Collection Time    10/28/12  2:29 PM      Result Value Range   Total Protein 8.6 (*) 6.0 - 8.3 g/dL   Albumin 4.5  3.5 - 5.2 g/dL   AST 42 (*) 0 - 37 U/L   ALT 35  0 - 53 U/L   Alkaline Phosphatase 96  39 - 117 U/L   Total Bilirubin 0.9  0.3 - 1.2 mg/dL   Bilirubin, Direct 0.2  0.0 - 0.3 mg/dL   Indirect Bilirubin 0.7  0.3 - 0.9 mg/dL  POCT I-STAT TROPONIN I     Status: None   Collection Time    10/28/12  2:36 PM      Result Value Range   Troponin i, poc 0.01  0.00 - 0.08 ng/mL   Comment 3  CG4 I-STAT (LACTIC ACID)     Status: Abnormal   Collection Time    10/28/12  3:50 PM      Result Value Range   Lactic Acid, Venous 2.32 (*) 0.5 - 2.2 mmol/L  ETHANOL     Status: None   Collection Time    10/28/12  3:56 PM      Result Value Range   Alcohol, Ethyl (B) <11  0 - 11 mg/dL  SALICYLATE LEVEL     Status: Abnormal   Collection Time    10/28/12  3:56 PM      Result Value Range   Salicylate Lvl <2.0 (*) 2.8 - 20.0 mg/dL  URINE RAPID DRUG SCREEN (HOSP PERFORMED)     Status: Abnormal    Collection Time    10/28/12  4:13 PM      Result Value Range   Opiates POSITIVE (*) NONE DETECTED   Cocaine NONE DETECTED  NONE DETECTED   Benzodiazepines NONE DETECTED  NONE DETECTED   Amphetamines NONE DETECTED  NONE DETECTED   Tetrahydrocannabinol NONE DETECTED  NONE DETECTED   Barbiturates NONE DETECTED  NONE DETECTED  URINALYSIS, ROUTINE W REFLEX MICROSCOPIC     Status: Abnormal   Collection Time    10/28/12  4:13 PM      Result Value Range   Color, Urine YELLOW  YELLOW   APPearance CLEAR  CLEAR   Specific Gravity, Urine 1.027  1.005 - 1.030   pH 5.5  5.0 - 8.0   Glucose, UA NEGATIVE  NEGATIVE mg/dL   Hgb urine dipstick NEGATIVE  NEGATIVE   Bilirubin Urine SMALL (*) NEGATIVE   Ketones, ur 15 (*) NEGATIVE mg/dL   Protein, ur NEGATIVE  NEGATIVE mg/dL   Urobilinogen, UA 1.0  0.0 - 1.0 mg/dL   Nitrite NEGATIVE  NEGATIVE   Leukocytes, UA NEGATIVE  NEGATIVE  POCT I-STAT 3, BLOOD GAS (G3+)     Status: Abnormal   Collection Time    10/28/12  4:25 PM      Result Value Range   pH, Arterial 7.399  7.350 - 7.450   pCO2 arterial 36.2  35.0 - 45.0 mmHg   pO2, Arterial 77.0 (*) 80.0 - 100.0 mmHg   Bicarbonate 22.4  20.0 - 24.0 mEq/L   TCO2 23  0 - 100 mmol/L   O2 Saturation 95.0     Acid-base deficit 2.0  0.0 - 2.0 mmol/L   Patient temperature 37.0 C     Collection site RADIAL, ALLEN'S TEST ACCEPTABLE     Drawn by RT     Sample type ARTERIAL    OSMOLALITY     Status: None   Collection Time    10/28/12  6:20 PM      Result Value Range   Osmolality 288  275 - 300 mOsm/kg  MAGNESIUM     Status: None   Collection Time    10/28/12  6:23 PM      Result Value Range   Magnesium 2.1  1.5 - 2.5 mg/dL  TROPONIN I     Status: None   Collection Time    10/28/12  6:23 PM      Result Value Range   Troponin I <0.30  <0.30 ng/mL  BASIC METABOLIC PANEL     Status: Abnormal   Collection Time    10/28/12 11:55 PM      Result Value Range   Sodium 136  135 - 145 mEq/L   Potassium 3.7  3.5  -  5.1 mEq/L   Chloride 103  96 - 112 mEq/L   CO2 26  19 - 32 mEq/L   Glucose, Bld 125 (*) 70 - 99 mg/dL   BUN 11  6 - 23 mg/dL   Creatinine, Ser 9.52  0.50 - 1.35 mg/dL   Calcium 8.9  8.4 - 84.1 mg/dL   GFR calc non Af Amer >90  >90 mL/min   GFR calc Af Amer >90  >90 mL/min  CBC     Status: None   Collection Time    10/28/12 11:55 PM      Result Value Range   WBC 8.6  4.0 - 10.5 K/uL   RBC 4.83  4.22 - 5.81 MIL/uL   Hemoglobin 14.6  13.0 - 17.0 g/dL   HCT 32.4  40.1 - 02.7 %   MCV 83.9  78.0 - 100.0 fL   MCH 30.2  26.0 - 34.0 pg   MCHC 36.0  30.0 - 36.0 g/dL   RDW 25.3  66.4 - 40.3 %   Platelets 207  150 - 400 K/uL  LIPID PANEL     Status: Abnormal   Collection Time    10/28/12 11:55 PM      Result Value Range   Cholesterol 166  0 - 200 mg/dL   Triglycerides 474  <259 mg/dL   HDL 40  >56 mg/dL   Total CHOL/HDL Ratio 4.2     VLDL 21  0 - 40 mg/dL   LDL Cholesterol 387 (*) 0 - 99 mg/dL  TROPONIN I     Status: None   Collection Time    10/28/12 11:56 PM      Result Value Range   Troponin I <0.30  <0.30 ng/mL   Signed: Darden Palmer 10/29/2012, 12:45 PM   Time Spent on Discharge: 35 minutes Services Ordered on Discharge: none Equipment Ordered on Discharge: none

## 2012-10-29 NOTE — Progress Notes (Addendum)
Subjective: Darren Ellis was seen and examined at bedside this morning with his wife present in the room as well.  He reports remarkable improvement in his pain this morning.  He tolerated his breakfast well, denies any N/V/D, and has no pain at this time with improvement in his breathing as well.  He would like to be home for easter.  We discussed need for PCP follow up.    Objective: Vital signs in last 24 hours: Filed Vitals:   10/28/12 1832 10/28/12 1835 10/29/12 0006 10/29/12 0605  BP: 132/81 126/74 109/68 104/64  Pulse: 77 67 64 59  Temp:   98 F (36.7 C) 98.1 F (36.7 C)  TempSrc:      Resp:   18 16  Height:      Weight:      SpO2:   99% 98%   Weight change:   Intake/Output Summary (Last 24 hours) at 10/29/12 0815 Last data filed at 10/29/12 0200  Gross per 24 hour  Intake  932.5 ml  Output      0 ml  Net  932.5 ml   Vitals reviewed. General: resting in bed, NAD HEENT: PERRL, EOMI, no scleral icterus Cardiac: RRR, no rubs, murmurs or gallops Chest: no tenderness to palpation of sternal region Pulm: clear to auscultation bilaterally, no wheezes, rales, or rhonchi Abd: soft, nontender, nondistended, BS present Ext: warm and well perfused, no pedal edema, +2dp b/l Neuro: alert and oriented X3, cranial nerves II-XII grossly intact, strength and sensation to light touch equal in bilateral upper and lower extremities  Lab Results: Basic Metabolic Panel:  Recent Labs Lab 10/28/12 1429 10/28/12 1823 10/28/12 2355  NA 139  --  136  K 3.2*  --  3.7  CL 99  --  103  CO2 16*  --  26  GLUCOSE 114*  --  125*  BUN 11  --  11  CREATININE 0.77  --  0.74  CALCIUM 9.6  --  8.9  MG  --  2.1  --    Liver Function Tests:  Recent Labs Lab 10/28/12 1429  AST 42*  ALT 35  ALKPHOS 96  BILITOT 0.9  PROT 8.6*  ALBUMIN 4.5    Recent Labs Lab 10/28/12 1429  LIPASE 35   CBC:  Recent Labs Lab 10/28/12 1429 10/28/12 2355  WBC 10.9* 8.6  HGB 17.2* 14.6  HCT 46.7  40.5  MCV 84.1 83.9  PLT 259 207   Cardiac Enzymes:  Recent Labs Lab 10/28/12 1823 10/28/12 2356  TROPONINI <0.30 <0.30   BNP:  Recent Labs Lab 10/28/12 1429  PROBNP 8.3   Fasting Lipid Panel:  Recent Labs Lab 10/28/12 2355  CHOL 166  HDL 40  LDLCALC 105*  TRIG 106  CHOLHDL 4.2   Urine Drug Screen: Drugs of Abuse     Component Value Date/Time   LABOPIA POSITIVE* 10/28/2012 1613   COCAINSCRNUR NONE DETECTED 10/28/2012 1613   LABBENZ NONE DETECTED 10/28/2012 1613   AMPHETMU NONE DETECTED 10/28/2012 1613   THCU NONE DETECTED 10/28/2012 1613   LABBARB NONE DETECTED 10/28/2012 1613    Alcohol Level:  Recent Labs Lab 10/28/12 1556  ETH <11   Urinalysis:  Recent Labs Lab 10/28/12 1613  COLORURINE YELLOW  LABSPEC 1.027  PHURINE 5.5  GLUCOSEU NEGATIVE  HGBUR NEGATIVE  BILIRUBINUR SMALL*  KETONESUR 15*  PROTEINUR NEGATIVE  UROBILINOGEN 1.0  NITRITE NEGATIVE  LEUKOCYTESUR NEGATIVE   Studies/Results: Dg Chest Port 1 View  10/28/2012  *RADIOLOGY  REPORT*  Clinical Data: Abdominal pain, shortness of breath.  PORTABLE CHEST - 1 VIEW  Comparison: None  Findings: Heart size is accentuated by low volumes.  No confluent airspace opacity or effusion.  No acute bony abnormality.  IMPRESSION: Low lung volumes.  No acute findings.   Original Report Authenticated By: Charlett Nose, M.D.    Medications: I have reviewed the patient's current medications. Scheduled Meds: . aspirin  81 mg Oral Daily  . folic acid  1 mg Oral Daily  . heparin  5,000 Units Subcutaneous Q8H  . multivitamin with minerals  1 tablet Oral Daily  . pantoprazole  40 mg Oral BID AC  . pneumococcal 23 valent vaccine  0.5 mL Intramuscular Tomorrow-1000  . sodium chloride  3 mL Intravenous Q12H  . thiamine  100 mg Oral Daily   Or  . thiamine  100 mg Intravenous Daily   Continuous Infusions: . 0.9 % NaCl with KCl 20 mEq / L 125 mL/hr at 10/29/12 0303   PRN Meds:.gi cocktail, LORazepam, LORazepam,  morphine injection, nitroGLYCERIN, ondansetron (ZOFRAN) IV, ondansetron  Assessment/Plan: Darren Ellis is a 32 year old male Spanish speaking male admitted for substernal chest discomfort.   Substernal chest pain--Resolved today.  Presented initially with x2 days, localized to mid chest between breasts, tender to palpation, burning sensation, non-radiating, 8/10. Started after drinking 6 beers yesterday. Reports mild discomfort in the past with alcohol but not as severe. No dysphagia, no radiation to neck or shoulders. Pain likely secondary to esophagitis induced by alcohol intake.  Other differentials may include Hiatal hernia vs. MSK vs. He denies NSAID use and has no abdominal pain at this time.  However, due to severity of pain on admission concern for possible PUD as well.  CXR on admission low lung volumes, no acute findings. No air under the diaphragm making perforation less likely.    -troponin x3 negative including poc in ED -ASA  -gi cocktail prn -zofran prn nausea  -protonix 40mg  qd  -risk stratify: HbA1C, and lipid panel: LDL 105, TG 106, HDL 40, Chol 166 -morphine prn pain  -nitroglycerin prn  -consider outpatient GI follow up if symptoms persist -due to severity of pain on admission and frequency of episodes especially with alcohol intake and concern for possible PUD as well, recommend lifestyle and dietary modifications along with standard dose PPI for eight weeks and then subsequent decrease to low dose PPIs and then to H2RAs if mild or intermittent symptoms with eventual discontinuation of acid suppression if remains asymptomatic.  If symptoms persist or worsen, may benefit from outpatient GI follow up and possible EGD; however, he has no insurance and thus will likely be unable to afford.  He will need follow up with PCP and in the mean time have recommended follow up in IM OPC.    Increased anion gap--Resolved.  AG 24 on admission with Bicarb on admission cmet: 16. per ABG: pH  7.399, PCO2: 36.2, P02: 77, HCO3: 22.4. o2 sat: 95%. Delta delta 1.75 signifying additional metabolic alkalosis (possibly secondary to vomiting) and per winters formula, expected PaCO2=30-34, but actual CO2 was 36.2, therefore more than expected value signifying additional respiratory disturbance (likely secondary to hypoventilation due to pain with inspiration). Recent alcohol intake of 6 beers yesterday. Denies intake of any other substances. No signs of uremia with Cr 0.77 on admission. Lactic acid slightly elevated at 2.32. U/A: 15 ketones with pH 5.5 with small bilirubin. Denies hx of DM with glucose on cmet 114. Likely  secondary to alcoholic ketoacidosis in setting of recent binge day prior to admisison. Salicylate level negative on admission.  -f/u serum osmolality: 288 with no osmolar gap -AG resolved to 7 with IVF -CIWA protocol including thiamine and folate  -counseled extensively on alcohol cessation  Hypokalemia--K 3.2 on admission. Possibly 2/2 GI losses, endorsed some vomiting prior to admission. Mag 2.1 -K improved to 3.7  ?seizure like activity--No more episodes during admission.  Likely secondary to pain.  Wife endorsed one episode of fist clenching and tight jaw, but still able to speak at time of extreme pain at time of triage. She claims he was slow to respond verbally and staring off in space but moving his eyes and talking. Given hx of alcohol abuse, will need to monitor closely for signs of withdrawal, no symptoms present at this time. Neuro exam intiially was significant for only slight right sided upper extremity weakness however seemed more effort dependent as he was lying down at an angle to prevent exacerbation of pain. Otherwise, CN's were intact and sensation to pin prick grossly intact and equal as well. Denies any headaches.  -no signs of withdrawal -CIWA protocol  -if behavior returns, will consider further work up   Diet: Regular diet as tolerated  DVT Ppx: Heparin   Dispo: d/c home today The patient does not have a current PCP (No primary provider on file.), therefore will be requiring OPC follow-up after discharge.  The patient does not have transportation limitations that hinder transportation to clinic appointments.  Services Needed at time of discharge: Y = Yes, Blank = No PT:   OT:   RN:   Equipment:   Other:     LOS: 1 day   Darden Palmer 10/29/2012, 8:15 AM

## 2012-11-14 ENCOUNTER — Encounter: Payer: Self-pay | Admitting: Internal Medicine

## 2013-10-28 ENCOUNTER — Emergency Department (HOSPITAL_COMMUNITY)
Admission: EM | Admit: 2013-10-28 | Discharge: 2013-10-29 | Disposition: A | Discharge status: Home / Self Care | Payer: Self-pay | Attending: Emergency Medicine | Admitting: Emergency Medicine

## 2013-10-28 ENCOUNTER — Encounter (HOSPITAL_COMMUNITY): Payer: Self-pay | Admitting: Emergency Medicine

## 2013-10-28 DIAGNOSIS — F17200 Nicotine dependence, unspecified, uncomplicated: Secondary | ICD-10-CM | POA: Insufficient documentation

## 2013-10-28 DIAGNOSIS — K2990 Gastroduodenitis, unspecified, without bleeding: Principal | ICD-10-CM

## 2013-10-28 DIAGNOSIS — K2970 Gastritis, unspecified, without bleeding: Secondary | ICD-10-CM | POA: Insufficient documentation

## 2013-10-28 HISTORY — DX: Alcohol dependence, uncomplicated: F10.20

## 2013-10-28 LAB — COMPREHENSIVE METABOLIC PANEL
ALK PHOS: 89 U/L (ref 39–117)
ALT: 32 U/L (ref 0–53)
AST: 33 U/L (ref 0–37)
Albumin: 4.1 g/dL (ref 3.5–5.2)
BUN: 9 mg/dL (ref 6–23)
CALCIUM: 9.5 mg/dL (ref 8.4–10.5)
CO2: 21 meq/L (ref 19–32)
Chloride: 101 mEq/L (ref 96–112)
Creatinine, Ser: 0.6 mg/dL (ref 0.50–1.35)
GFR calc Af Amer: >90 mL/min (ref >90)
Glucose, Bld: 96 mg/dL (ref 70–99)
POTASSIUM: 3.4 meq/L — AB (ref 3.7–5.3)
SODIUM: 140 meq/L (ref 137–147)
Total Bilirubin: 0.6 mg/dL (ref 0.3–1.2)
Total Protein: 7.7 g/dL (ref 6.0–8.3)

## 2013-10-28 LAB — CBC WITH DIFFERENTIAL/PLATELET
BASOS ABS: 0.1 10*3/uL (ref 0.0–0.1)
Basophils Relative: 1 % (ref 0–1)
EOS PCT: 1 % (ref 0–5)
Eosinophils Absolute: 0.1 10*3/uL (ref 0.0–0.7)
HCT: 41.7 % (ref 39.0–52.0)
Hemoglobin: 14.7 g/dL (ref 13.0–17.0)
LYMPHS ABS: 2.5 10*3/uL (ref 0.7–4.0)
LYMPHS PCT: 29 % (ref 12–46)
MCH: 30.4 pg (ref 26.0–34.0)
MCHC: 35.3 g/dL (ref 30.0–36.0)
MCV: 86.3 fL (ref 78.0–100.0)
Monocytes Absolute: 0.6 10*3/uL (ref 0.1–1.0)
Monocytes Relative: 7 % (ref 3–12)
NEUTROS ABS: 5.4 10*3/uL (ref 1.7–7.7)
NEUTROS PCT: 63 % (ref 43–77)
PLATELETS: 249 10*3/uL (ref 150–400)
RBC: 4.83 MIL/uL (ref 4.22–5.81)
RDW: 13.1 % (ref 11.5–15.5)
WBC: 8.6 10*3/uL (ref 4.0–10.5)

## 2013-10-28 LAB — LIPASE, BLOOD: Lipase: 54 U/L (ref 11–59)

## 2013-10-28 LAB — ETHANOL: Alcohol, Ethyl (B): 98 mg/dL — ABNORMAL HIGH (ref 0–11)

## 2013-10-28 MED ORDER — ONDANSETRON HCL 4 MG/2ML IJ SOLN: 4.0000 mg | Freq: Once | INTRAMUSCULAR | Status: DC

## 2013-10-28 NOTE — ED Notes (Signed)
Patient offered IV Zofran Patient refused, denies N/V--[facility] appears in NAD

## 2013-10-28 NOTE — ED Provider Notes (Signed)
CSN: [id number]     Arrival date & time 10/28/13  2227 History   First MD Initiated Contact with Patient 10/28/13 2307     Chief Complaint  Patient presents with  . Abdominal Pain  . Emesis     (Consider location/radiation/quality/duration/timing/severity/associated sxs/prior Treatment) HPI Comments: Pt comes in with cc of abd pain. Pt has no medical problems. Started having abd pain x 2 days, worse today. Pain is located in epigastrium, and moves to the sides. No hx of similar pain. No abd surgeries. Pain is not radiating to the back. Pt admits to drinking 2-3 beers a day. No hx of heavy nsaids use.  Patient is a 33 y.o. male presenting with abdominal pain and vomiting. The history is provided by the patient.  Abdominal Pain Associated symptoms: nausea and vomiting   Associated symptoms: no chest pain, no cough, no dysuria and no shortness of breath   Emesis Associated symptoms: abdominal pain     Past Medical History  Diagnosis Date  . Alcoholic    No past surgical history on file. No family history on file. History  Substance Use Topics  . Smoking status: Current Some Day Smoker    Types: Cigarettes  . Smokeless tobacco: Never Used  . Alcohol Use: Yes     Comment: 6 beers a day    Review of Systems  Constitutional: Negative for activity change and appetite change.  Respiratory: Negative for cough and shortness of breath.   Cardiovascular: Negative for chest pain.  Gastrointestinal: Positive for nausea and abdominal pain.  Genitourinary: Negative for dysuria.  All other systems reviewed and are negative.     Allergies  Review of patient's allergies indicates no known allergies.  Home Medications   Prior to Admission medications   Not on File   BP 110/65  Pulse 80  Temp(Src) 98.7 F (37.1 C) (Oral)  Resp 20  SpO2 98% Physical Exam  Nursing note and vitals reviewed. Constitutional: He is oriented to person, place, and time. He appears well-developed.   HENT:  Head: Normocephalic and atraumatic.  Eyes: Conjunctivae and EOM are normal. Pupils are equal, round, and reactive to light.  Neck: Normal range of motion. Neck supple.  Cardiovascular: Normal rate and regular rhythm.   Pulmonary/Chest: Effort normal and breath sounds normal.  Abdominal: Soft. Bowel sounds are normal. He exhibits no distension. There is tenderness. There is no rebound and no guarding.  Epigastric tenderness  Neurological: He is alert and oriented to person, place, and time.  Skin: Skin is warm.    ED Course  Procedures (including critical care time) Labs Review Labs Reviewed  CBC WITH DIFFERENTIAL  COMPREHENSIVE METABOLIC PANEL  LIPASE, BLOOD  ETHANOL    Imaging Review No results found.   EKG Interpretation None      MDM   Final diagnoses:  None    DDx includes: Pancreatitis Hepatobiliary pathology including cholecystitis Gastritis/PUD SBO ACS syndrome Aortic Dissection    Pt comes in with epigastric pain x 2 days. Pain moves to the sides as well. Exam shows epigastric tenderness, with normal labs. Will get US abd. If US is negative, will treat as gastritis. Pt drinks 2 beers / day - so pancreatitis is also possible - and we will give him discharge instructions on both gastritis and pancreatitis.   Derwood KaplanAnkit Judi Jaffe, MD 10/29/13 [phone]

## 2013-10-28 NOTE — ED Notes (Signed)
EMS-pt c/o RUQ abdominal pain associated with shortness of breath and vomiting x 4-pain started mid abdomen and has radiated up to both quadrants-pt does not speak English-pt has neighbor at bedside to interpret

## 2013-10-29 ENCOUNTER — Emergency Department (HOSPITAL_COMMUNITY): Payer: Self-pay

## 2013-10-29 MED ORDER — GI COCKTAIL ~~LOC~~
30.0000 mL | Freq: Once | ORAL | Status: AC
  Administered 2013-10-29: 30 mL via ORAL
  Filled 2013-10-29: qty 30

## 2013-10-29 MED ORDER — OMEPRAZOLE 20 MG PO CPDR: 20.0000 mg | DELAYED_RELEASE_CAPSULE | Freq: Every day | ORAL | Status: AC

## 2013-10-29 MED ORDER — HYDROCODONE-ACETAMINOPHEN 5-325 MG PO TABS
1.0000 | ORAL_TABLET | Freq: Once | ORAL | Status: AC
  Administered 2013-10-29: 1 via ORAL
  Filled 2013-10-29: qty 1

## 2013-10-29 MED ORDER — TRAMADOL HCL 50 MG PO TABS: 50.0000 mg | ORAL_TABLET | Freq: Four times a day (QID) | ORAL | Status: AC | PRN

## 2013-10-29 NOTE — Discharge Instructions (Signed)
Gastritis - Adultos  (Gastritis, Adult)  La gastrittis es la irritacin (inflamacin) de la membrana interna del estmago. Puede ser Neomia Dearuna enfermedad de inicio sbito (aguda) o de largo plazo (crnica). Si la gastritis no se trata, puede causar sangrado y lceras. CAUSAS  La gastritis se produce cuando la membrana que tapiza interiormente al estmago se debilita o se daa. Los jugos digestivos del estmago inflaman el revestimiento del estmago debilitado. El revestimiento del estmago puede debilitarse o daarse por una infeccin viral o bacteriana. La infeccin bacteriana ms comn es la infeccin por Helicobacter pylori. Tambin puede ser el resultado del consumo excesivo de alcohol, por el uso de ciertos medicamentos o porque hay demasiado cido en el estmago.  SNTOMAS  En algunos casos no hay sntomas. Si se presentan sntomas, stos pueden ser:   Dolor o sensacin de ardor en la parte superior del abdomen.  Nuseas.  Vmitos.  Sensacin molesta de distensin despus de comer. DIAGNSTICO  El mdico puede diagnosticar gastritis segn los sntomas y el examen fsico. Para determinar la causa de la gastritis, el mdico podr:   Pedir anlisis de sangre o de materia fecal para diagnosticar la presencia de la bacteria H pylori.  Gastroscopa. Un tubo delgado y flexible (endoscopio) se pasa por Theatre stage managerel esfago hasta llegar al Teachers Insurance and Annuity Associationestmago. El endoscopio tiene [place] luz y una cmara en el extremo. El mdico utilizar el endoscopio para observar el interior del [place]ago.  Tomar una muestra de tejido (biopsia) del estmago para examinarlo en el microscopio. TRATAMIENTO  Segn la causa de la gastritis podrn recetarle: Antibiticos, si la causa es una infeccin bacteriana, como una infeccin por H. pylori. Anticidos o bloqueadores H2, si hay demasiado cido en el estmago. El Office Depotmdico le aconsejar que deje de tomar aspirina, ibuprofeno u otros antiinflamatorios no esteroides (AINE).  INSTRUCCIONES PARA EL  CUIDADO EN EL HOGAR   Tome slo medicamentos de venta libre o recetados, segn las indicaciones del mdico.  Si le han recetado antibiticos, tmelos segn las indicaciones. Tmelos todos, aunque se sienta mejor.  Debe ingerir gran cantidad de lquido para mantener la orina de tono claro o color amarillo plido.  Evite las comidas y bebidas que [address]empeoran los [place]berry Lakeproblemas, [place]:  [place]esota[place]ebidas con cafena o alcohlicas.  Chocolate.  Sabores a Advertising account plannermenta.  Ajo y cebolla.  Comidas muy condimentadas.  Ctricos como naranjas, limones o limas.  Alimentos que contengan tomate, como salsas, [place] y pizza.  Alimentos fritos y Lexicographergrasos.  Haga comidas pequeas durante Glass blower/designerel da en lugar de 3 comidas abundantes. SOLICITE ATENCIN MDICA DE INMEDIATO SI:   La materia fecal es negra o de color rojo oscuro.  Vomita sangre de color rojo brillante o material similar a granos de caf.  No puede retener los lquidos.  El dolor abdominal empeora.  Tiene fiebre.  No mejora luego de 1 semana.  Tiene preguntas o preocupaciones. ASEGRESE DE QUE:   Comprende estas instrucciones.  Controlar su enfermedad.  Solicitar ayuda de inmediato si no mejora o si empeora. Document Released: 04/07/2005 Document Revised: 03/22/2012 [facility] CenterExit[facility] Patient Information 2014 [place]ridgeExitCare, MarylandLLC. Pancreatitis aguda  (Acute Pancreatitis)  La pancreatitis aguda es una enfermedad en la que el pncreas se inflama de manera sbita. El pncreas es una glndula grande ubicada detrs del [place]estmago. Produce enzimas que ayudan a digerir los alimentos. Tambin libera las hormonas glucagon e insulina que regulan el nivel de azcar en la [place] Fallssangre. El dao al pncreas se produce cuando las enzimas digestivas del pncreas se [place]activan y comienzan a  atacarlo antes de liberarse en el intestino. La mayor parte de los ataques agudos dura un par de [address]  [id number] y puede ocasionar complicaciones graves. Algunas personas se deshidratan y les baja la presin arterial.  En los casos graves, una hemorragia en el pncreas puede causar shock y Biochemist, clinicalponer en peligro la vida. Los pulmones, el corazn y los riones Estate agentpueden fallar. CAUSAS  La pancreatitis puede ocurrirle a cualquier persona. En algunos casos, la causa es desconocida. En la International Business Machinesmayora de los casos, la causa es:   El consumo excesivo de alcohol.  Clculos biliares. Otras causas menos frecuentes son:   Algunos medicamentos.  La exposicin a ciertas sustancias qumicas.  Infecciones.  Dao producido por un accidente (traumatismo).  Una ciruga abdominal. SNTOMAS   Dolor en la zona superior del abdomen que puede irradiarse a la espalda.  Sensibilidad e hinchazn en el abdomen.  Nuseas y vmitos. DIAGNSTICO  El Office Depotmdico le har un examen fsico. Es necesario realizar un anlisis de sangre y de materia fecal para confirmar el diagnstico. Tambin le solicitarn pruebas de diagnstico por imgenes, como radiografas, tomografa computada y ecografa del abdomen.  TRATAMIENTO  El tratamiento generalmente requiere que permanezca en el hospital. El tratamiento puede incluir:   Analgsicos.  Reposicin de lquidos por va intravenosa (IV).  Colocar un tubo en el estmago para retirar los contenidos y English as a second language teachercontrolar los vmitos.  No comer durante 3 o 4 das. Esto permite que el pncreas descanse y no produzca enzimas que podran causar ms dao.  Antibiticos si la causa de la enfermedad es una infeccin.  Ciruga del pncreas o de la vescula biliar. INSTRUCCIONES PARA EL CUIDADO EN EL HOGAR   Siga la dieta que le indica el profesional que lo asiste. Puede consistir en evitar el alcohol y disminuir la cantidad de grasas que consume en la dieta.  Consuma porciones pequeas y con frecuencia. Esto reducir la cantidad de jugos digestivos que produce el pncreas.  Debe ingerir gran cantidad de lquido para mantener la orina de tono claro o color amarillo plido.  Tome slo medicamentos de venta libre o  recetados, segn las indicaciones del mdico.  Evite el consumo de alcohol si esta fue la causa de su enfermedad.  No fume.  Haga reposo.  Controle su nivel de azcar en sangre tal como le indic el mdico.  Cumpla con todas las visitas de control, segn le indique su mdico. SOLICITE ATENCIN MDICA SI:   No mejora en el tiempo en que se esperaba.  Tiene sntomas nuevos o graves.  Tiene dolor persistente, nuseas o debilidad.  Se recupera y luego vuelve a Financial risk analystsentir dolor. SOLICITE ATENCIN MDICA DE INMEDIATO SI:   No puede comer o retener los lquidos.  El dolor se hace ms intenso.  Tiene fiebre o sntomas que persisten durante ms de 2 o 3 das.  Tiene fiebre y los sntomas empeoran de manera sbita.  Nota que la piel o la zona blanca del ojo estn amarillas (ictericia).  Comienza a vomitar.  Se siente mareado o se desmaya.  Su nivel de azcar en sangre es alto (ms de 300 mg/dL). ASEGRESE DE QUE:   Comprende estas instrucciones.  Controlar su enfermedad.  Solicitar ayuda de inmediato si no mejora o si empeora. Document Released: 04/07/2005 Document Revised: 03/22/2012 [facility]Exit[facility] Patient Information 2014 [place]elandExitCare, MarylandLLC.

## 2014-08-13 IMAGING — CR DG CHEST 1V PORT
1 series · 1 of 1 positions shown · non-contrast
Comparison: None

CLINICAL DATA: Abdominal pain, shortness of breath.

PORTABLE CHEST - 1 VIEW

[AP]
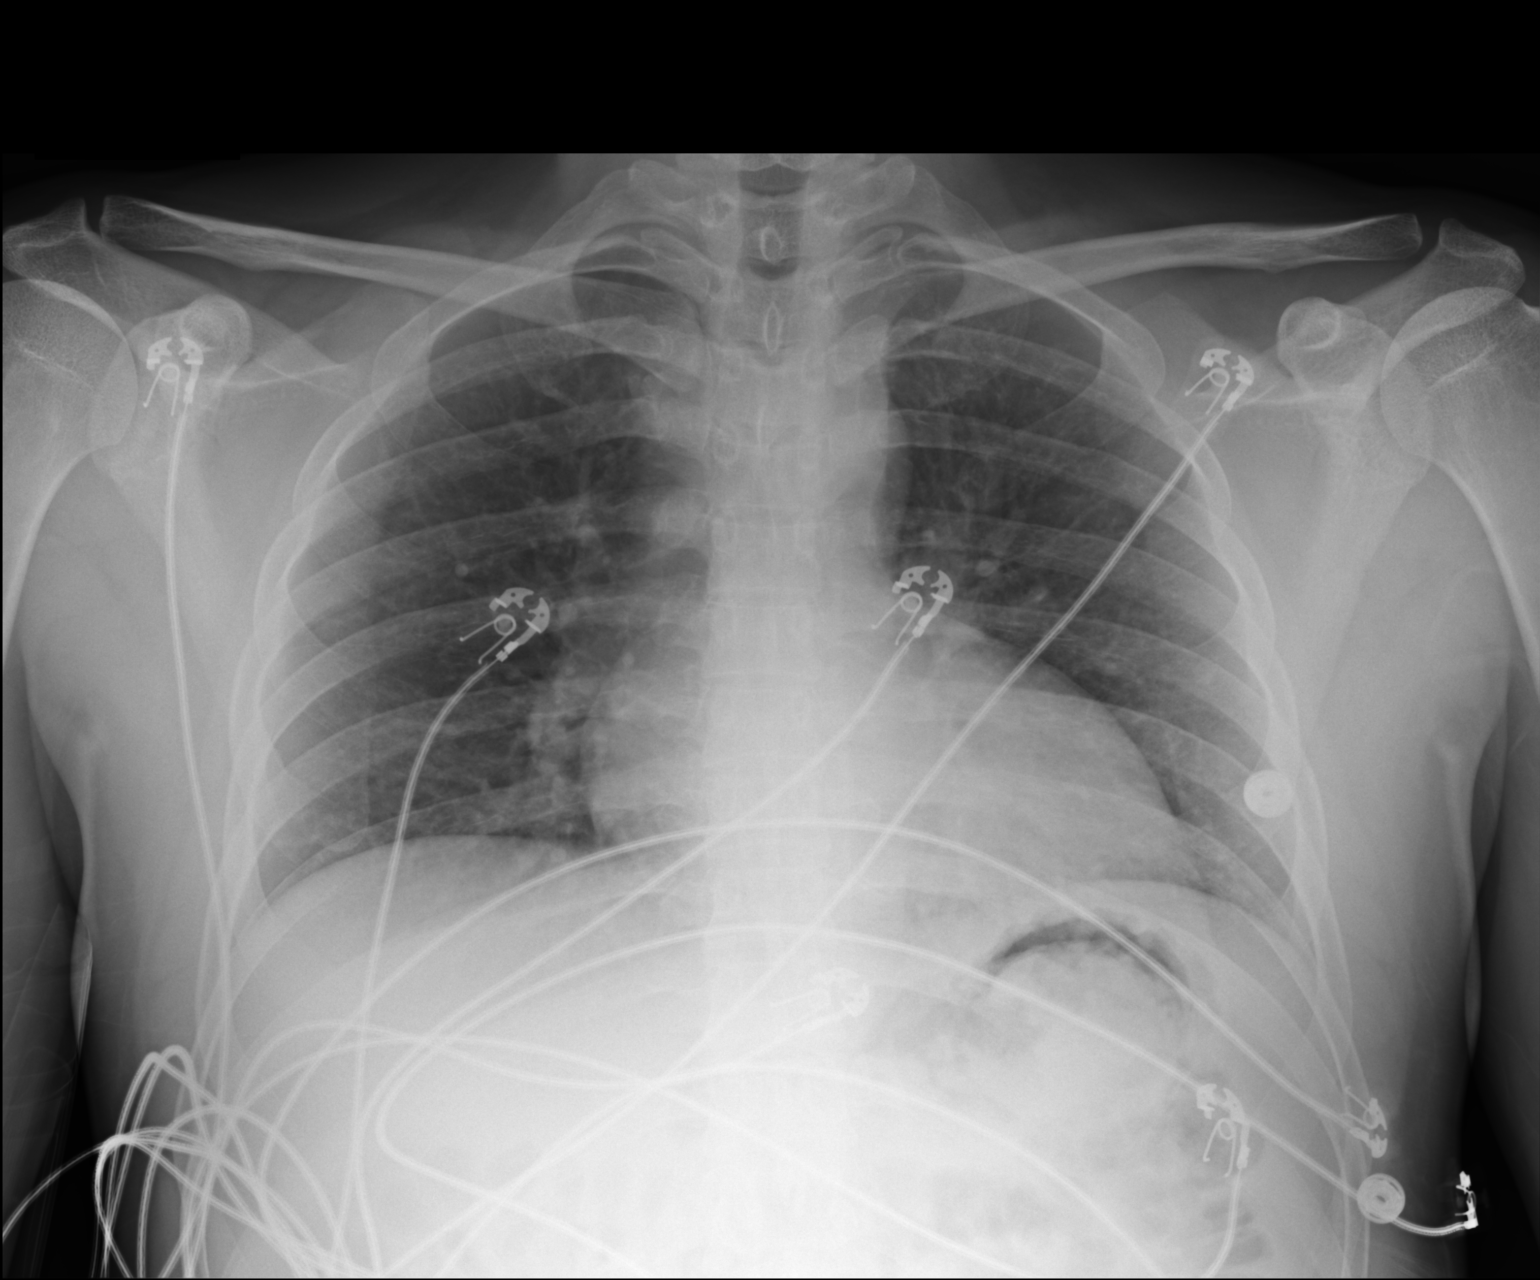

[1 of 1 positions shown; findings below may reference images not displayed]

FINDINGS: Heart size is accentuated by low volumes.  No confluent
airspace opacity or effusion.  No acute bony abnormality.
IMPRESSION: Low lung volumes.  No acute findings.
